# Patient Record
Sex: Male | Born: 1975 | Race: White | Hispanic: No | Marital: Single | Smoking: Former smoker
Health system: Southern US, Community
[De-identification: ages and names within clinical notes are randomized; demographics above are authoritative.]

## PROBLEM LIST (undated history)

## (undated) DIAGNOSIS — Z973 Presence of spectacles and contact lenses: Secondary | ICD-10-CM

## (undated) DIAGNOSIS — R7303 Prediabetes: Secondary | ICD-10-CM

## (undated) DIAGNOSIS — Z8489 Family history of other specified conditions: Secondary | ICD-10-CM

## (undated) DIAGNOSIS — K219 Gastro-esophageal reflux disease without esophagitis: Secondary | ICD-10-CM

## (undated) HISTORY — PX: TONSILLECTOMY: SUR1361

---

## 2008-09-30 ENCOUNTER — Emergency Department: Payer: Self-pay | Admitting: Emergency Medicine

## 2009-06-12 ENCOUNTER — Emergency Department: Payer: Self-pay | Admitting: Internal Medicine

## 2010-06-30 IMAGING — CR DG CHEST 2V
1 series · 2 of 2 positions shown · non-contrast
Comparison: none

REASON FOR EXAM: chest pain
COMMENTS:

[Series 1: view not recorded · 0.17mm/px · 2 of 2 slices shown]
[im 1/2]
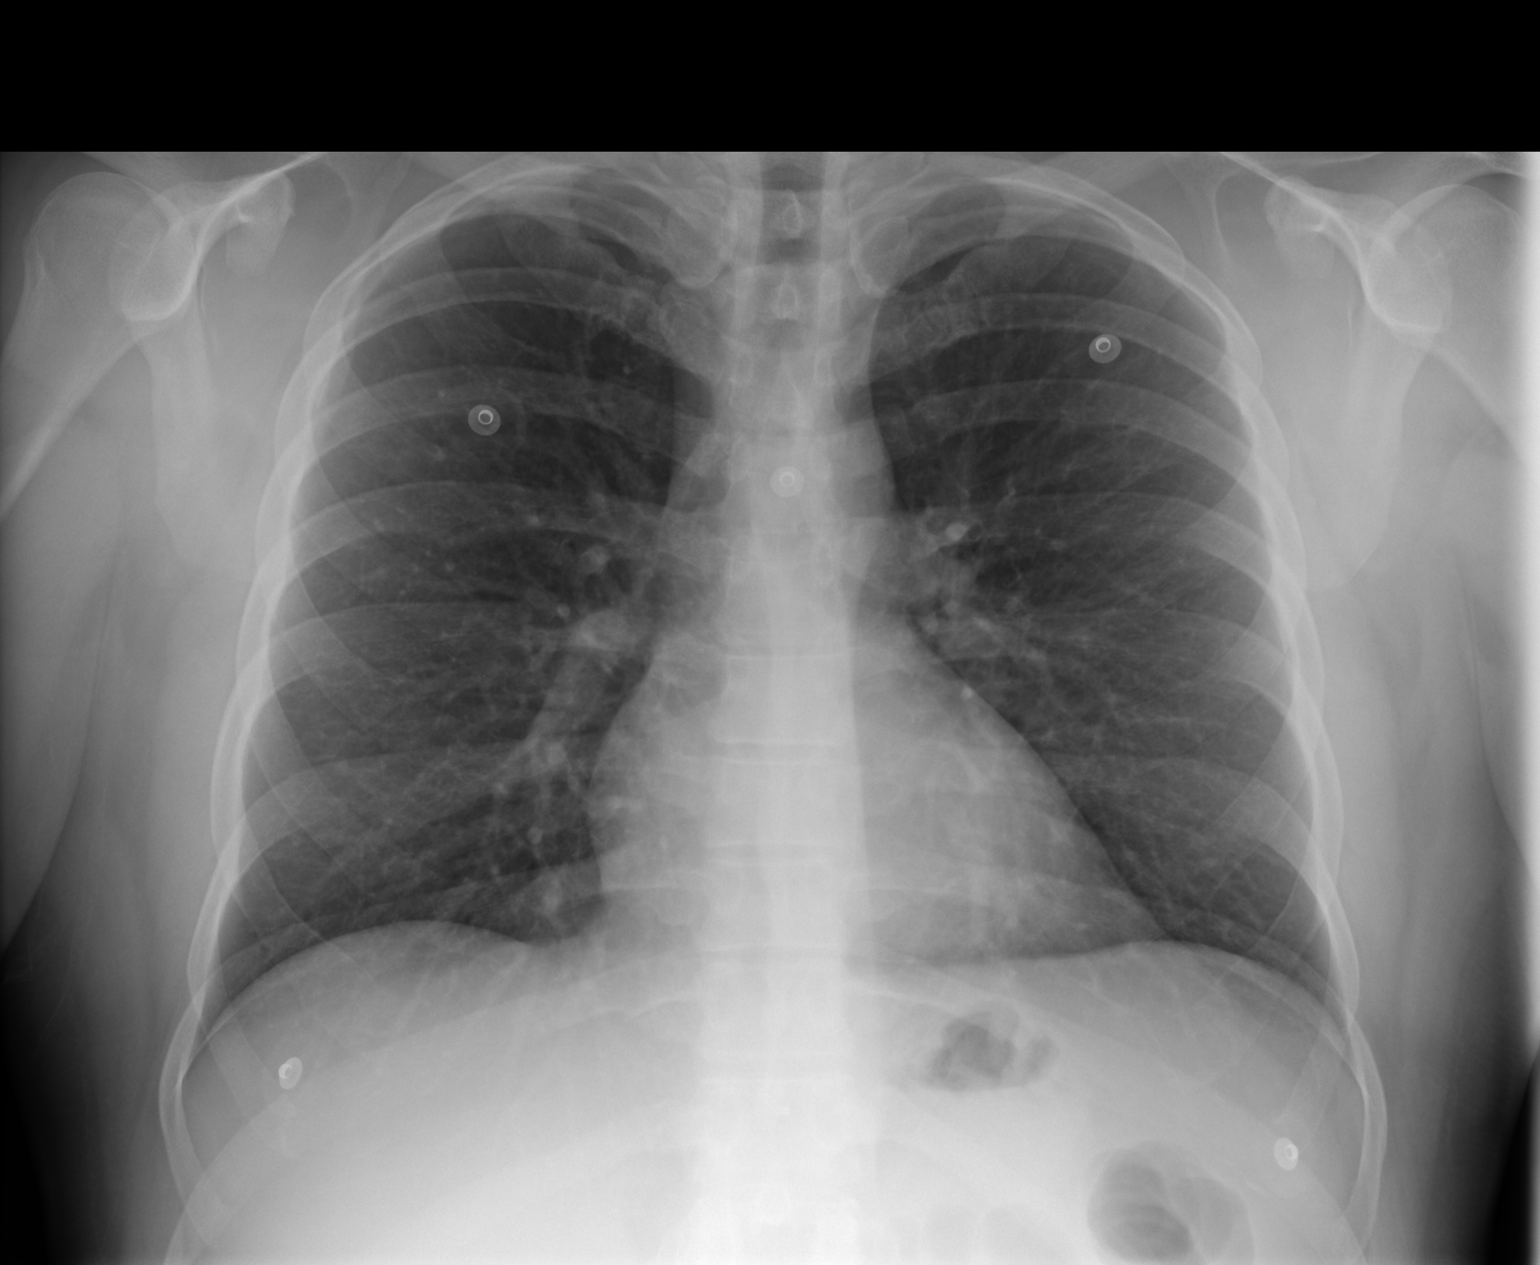
[im 2/2]
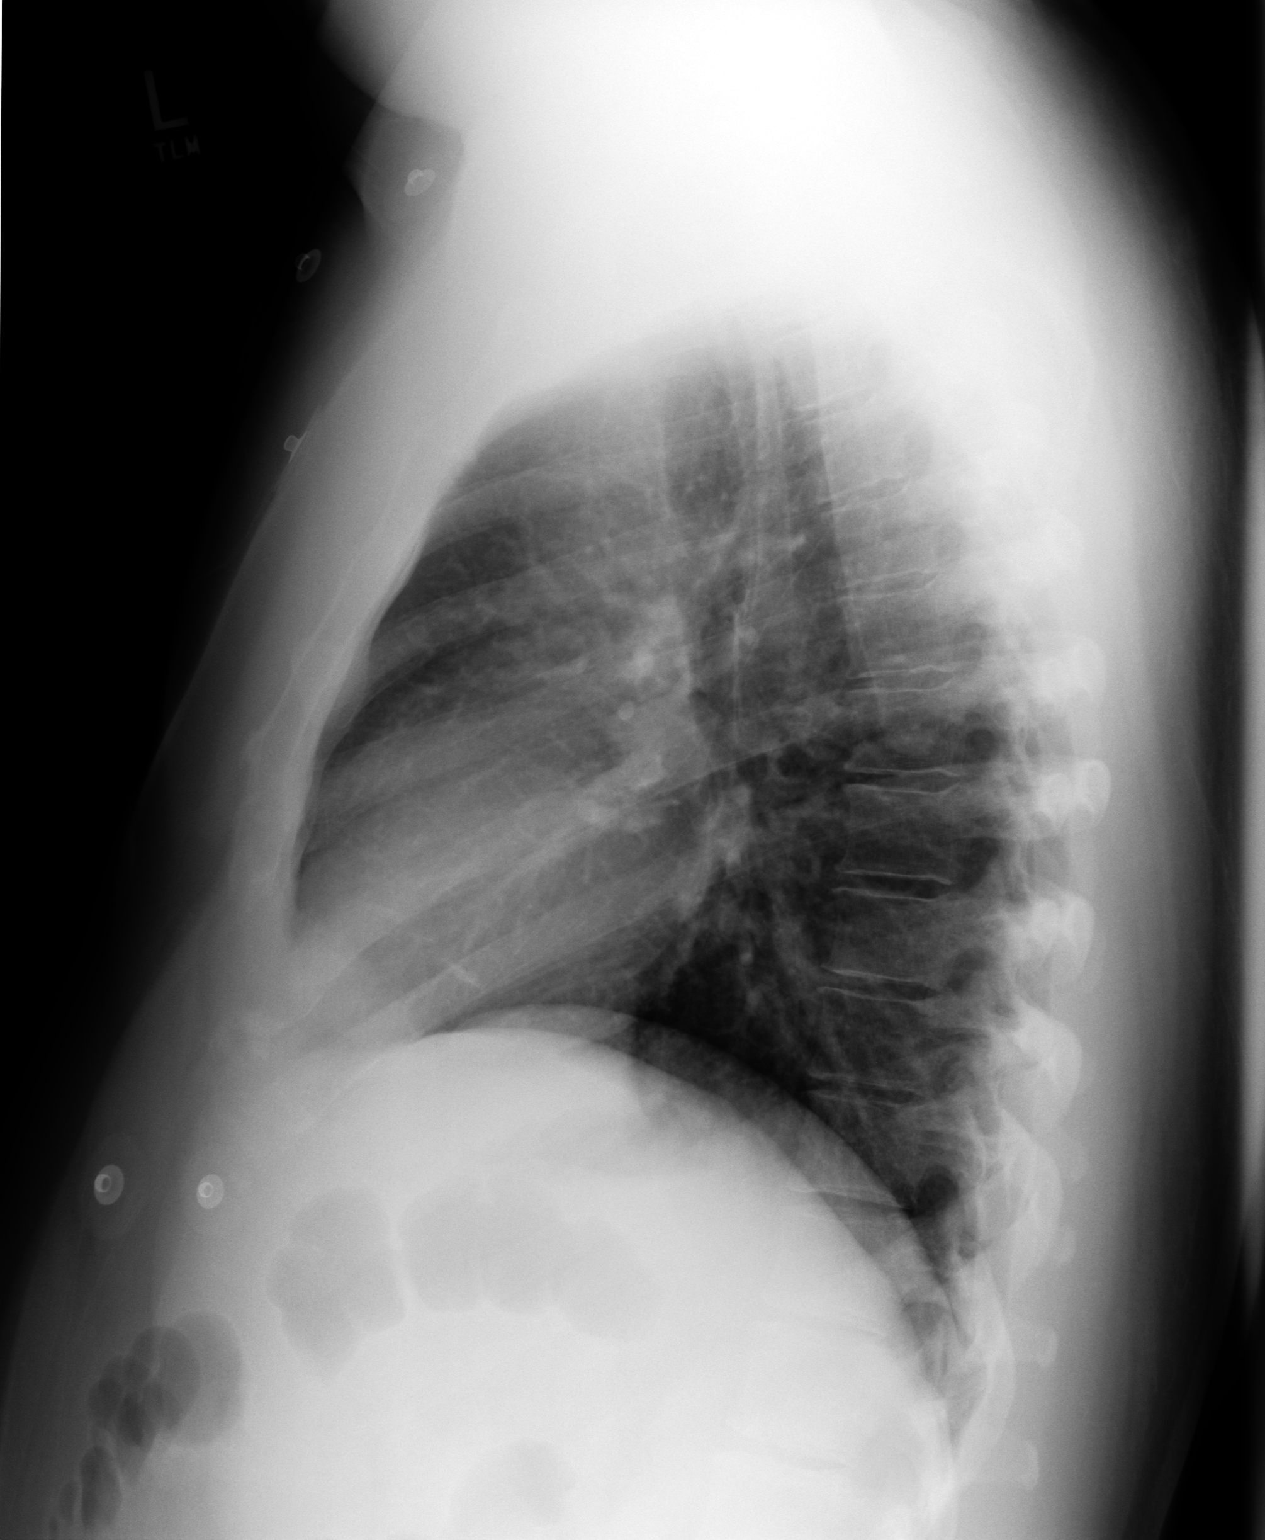

[2 of 2 positions shown; findings below may reference images not displayed]

PROCEDURE:     DXR - DXR CHEST PA (OR AP) AND LATERAL  - September 30, 2008 [DATE]

RESULT:     PA and lateral views of the chest show the lung fields to be
clear. No pneumonia, pneumothorax or pleural effusion is seen. The heart
size is normal. The mediastinal and osseous structures show no significant
abnormalities
IMPRESSION: No significant abnormalities are noted.

## 2012-07-11 ENCOUNTER — Inpatient Hospital Stay: Payer: Self-pay | Admitting: Internal Medicine

## 2012-07-11 LAB — URINALYSIS, COMPLETE
Bacteria: NONE SEEN
Glucose,UR: NEGATIVE mg/dL (ref 0–75)
Leukocyte Esterase: NEGATIVE
Nitrite: NEGATIVE
Specific Gravity: 1.02 (ref 1.003–1.030)
Squamous Epithelial: <1

## 2012-07-11 LAB — DIFFERENTIAL
Basophil #: 0.1 10*3/uL (ref 0.0–0.1)
Eosinophil #: 0.1 10*3/uL (ref 0.0–0.7)
Eosinophil %: 0.4 %
Lymphocyte #: 2 10*3/uL (ref 1.0–3.6)

## 2012-07-11 LAB — COMPREHENSIVE METABOLIC PANEL
Alkaline Phosphatase: 92 U/L (ref 50–136)
BUN: 11 mg/dL (ref 7–18)
Bilirubin,Total: 0.8 mg/dL (ref 0.2–1.0)
Chloride: 102 mmol/L (ref 98–107)
Co2: 27 mmol/L (ref 21–32)
EGFR (Non-African Amer.): >60
Glucose: 102 mg/dL — ABNORMAL HIGH (ref 65–99)
Osmolality: 277 (ref 275–301)
SGOT(AST): 26 U/L (ref 15–37)
SGPT (ALT): 33 U/L (ref 12–78)
Total Protein: 9.8 g/dL — ABNORMAL HIGH (ref 6.4–8.2)

## 2012-07-11 LAB — CBC
HGB: 14 g/dL (ref 13.0–18.0)
MCH: 30 pg (ref 26.0–34.0)
MCV: 91 fL (ref 80–100)
RBC: 4.67 10*6/uL (ref 4.40–5.90)
RDW: 12.5 % (ref 11.5–14.5)
WBC: 14.3 10*3/uL — ABNORMAL HIGH (ref 3.8–10.6)

## 2012-07-12 LAB — BASIC METABOLIC PANEL
Creatinine: 0.83 mg/dL (ref 0.60–1.30)
Glucose: 125 mg/dL — ABNORMAL HIGH (ref 65–99)
Potassium: 4.2 mmol/L (ref 3.5–5.1)

## 2012-07-12 LAB — CBC WITH DIFFERENTIAL/PLATELET
Basophil #: 0.1 10*3/uL
Basophil %: 0.9 %
Eosinophil #: 0 10*3/uL
Eosinophil %: 0 %
HCT: 40.1 %
HGB: 13.8 g/dL
Lymphocyte %: 11.8 %
Lymphs Abs: 1.4 10*3/uL
MCH: 31.2 pg
MCHC: 34.3 g/dL
MCV: 91 fL
Monocyte #: 0.4 10*3/uL
Monocyte %: 3.2 %
Neutrophil #: 10.2 10*3/uL — ABNORMAL HIGH
Neutrophil %: 84.1 %
Platelet: 311 10*3/uL
RBC: 4.41 x10 6/mm 3
RDW: 12.3 %
WBC: 12.1 10*3/uL — ABNORMAL HIGH

## 2012-07-12 LAB — RAPID INFLUENZA A&B ANTIGENS

## 2012-08-21 ENCOUNTER — Ambulatory Visit: Payer: Self-pay | Admitting: Otolaryngology

## 2012-08-25 ENCOUNTER — Emergency Department: Payer: Self-pay | Admitting: Emergency Medicine

## 2012-08-25 LAB — CBC
MCHC: 34.7 g/dL (ref 32.0–36.0)
Platelet: 356 10*3/uL (ref 150–440)
RBC: 4.5 10*6/uL (ref 4.40–5.90)
RDW: 13 % (ref 11.5–14.5)
WBC: 10.7 10*3/uL — ABNORMAL HIGH (ref 3.8–10.6)

## 2012-08-25 LAB — BASIC METABOLIC PANEL
Anion Gap: 6 — ABNORMAL LOW (ref 7–16)
BUN: 10 mg/dL (ref 7–18)
Calcium, Total: 8.9 mg/dL (ref 8.5–10.1)
Co2: 28 mmol/L (ref 21–32)
Osmolality: 273 (ref 275–301)
Potassium: 3.5 mmol/L (ref 3.5–5.1)

## 2014-08-13 NOTE — Discharge Summary (Signed)
PATIENT NAME:  Greg Carroll, Greg Carroll MR#:  [id number] DATE OF BIRTH:  [birth year]  DATE OF ADMISSION:  07/11/2012 DATE OF DISCHARGE:  07/13/2012  DISCHARGE DIAGNOSES:  1. Peritonsillar abscess.  2. Odynophagia.   PRIMARY CARE PHYSICIAN: Dr. Vonita MossMark Crissman in [place]   HISTORY OF PRESENT ILLNESS: The patient is a 39 year old male with known history of gastroesophageal reflux disease, being admitted for peritonsillar abscess. The patient started having severe sore throat since 2 days ago.  He went to urgent care center and back to [facility].  After 2 days,  he was diagnosed with streptococcal  throat infection and was prescribed Z-Pak.  He took it for 2 days and still in the night he was getting worse. He had trouble swallowing, chest congestion and some cough with phlegm.  He got so bad that he had severe body ache, unable to swallow anything, and so he decided to come to the Emergency Room. He was found having possible peritonsillar abscess. ENT, with Dr. Elenore RotaJuengel, saw the patient.  He tried an incision and drainage at bedside, but he was unsuccessful; and so he advised to admit with IV antibiotics, and once he starts swallowing oral then he can follow in the clinic later on.    HOSPITAL COURSE: He was started on IV clindamycin and IV Solu-Medrol and having significant response to the treatment within the next 2 days. He started eating a regular diet, and his swelling and pain went down.  He remained afebrile in the hospital and so was discharged on oral medication and advised to follow in the ENT.   Other medical issues addressed during this hospital stay:  1. Flu-like symptoms of body ache, fever. Possibly it was all because of peritonsillar abscess.  Flu test was negative.  2. Leukocytosis:  It was due to abscess.  3. Gastroesophageal reflux disease: Protonix was continued while he was in the hospital.  HOSPITAL LABORATORY DATA: WBC count was 14.3, came down to 12.1 on discharge. Influenza A and B were  negative. Urinalysis was grossly negative. Monospot stat was negative. Creatinine was 0.85.   CONDITION ON DISCHARGE: Stable.   CODE STATUS ON DISCHARGE:  FULL CODE.   DISCHARGE MEDICATIONS:  Prednisone 10 mg oral tablets, start at 60 mg and taper x 10 mg daily until complete, clindamycin 300 mg oral capsule 3 times a day for 6 days, lactobacillus acidophilus 1 capsule orally 2 times a day for 12 days.   DISCHARGE DIET:  Regular diet, consistency regular.   ACTIVITY: As tolerated.   TIMEFRAME TO FOLLOWUP: Within 1 to 2 weeks in ENT clinic; and if he develops diarrhea, then he was advised to call his doctor immediately. Need  tonsillectomy follow with ENT clinic and with primary care physician, Dr. Vonita MossMark Crissman.   TOTAL TIME SPENT: 45 minutes.   ____________________________ Hope PigeonVaibhavkumar G. Elisabeth PigeonVachhani, MD vgv:cb D: 07/17/2012 14:21:16 ET T: 07/17/2012 14:52:13 ET JOB#: [id number]  cc: Hope PigeonVaibhavkumar G. Elisabeth PigeonVachhani, MD, <Dictator> Steele SizerMark A. Crissman, MD Cammy CopaPaul H. Juengel, MD  Altamese DillingVAIBHAVKUMAR Shoni Quijas MD ELECTRONICALLY SIGNED 07/24/2012 14:54

## 2014-08-13 NOTE — H&P (Signed)
PATIENT NAME:  Elmon KirschnerSHATTERLY, Reace R MR#:  [id number] DATE OF BIRTH:  [birth year]  DATE OF ADMISSION:  07/11/2012  PRIMARY CARE PHYSICIAN:  Vonita MossMark Crissman, MD  REQUESTING PHYSICIAN:  Maricela BoLuna Ragsdale, MD   CHIEF COMPLAINT: Difficulty swallowing and sore throat.   HISTORY OF PRESENT ILLNESS: The patient is a 39 year old male with a known history of gastroesophageal reflux disease. He is being admitted for peritonsillar abscess. The patient started having sore throat and fever since Monday, went to urgent care and back to [facility]  on Wednesday where he was diagnosed with strep throat and prescribed Z-Pak. He took 2 tablets on Wednesday night and 1 tablet 1 tablet on Thursday, but kept getting worse. He was having more trouble swallowing, chest congestion and some cough with white phlegm.  Today he could not get bed, was having severe body ache and was unable to swallow anything and decided to come to the Emergency Department. While in the ED he was found to have possible peritonsillar abscess. ENT, Dr. Elenore RotaJuengel, saw the patient and tried to do an incision and drainage at bedside but  was unsuccessful. The patient was unable to take oral pills or even water/ice and is being admitted for further evaluation and monitoring.   PAST MEDICAL HISTORY:  GERD.  MEDICATIONS AT HOME:  None.   ALLERGIES:   1.  ASPIRIN. 2.  ERYTHROMYCIN. 3.  PENICILLIN. 4.  SULFA.  SOCIAL HISTORY: No smoking. No alcohol. He works at [employer].   FAMILY HISTORY: Mother has breast and diabetes. Father had coronary artery disease with 14 stents and bypass, also had MI and diabetes.   REVIEW OF SYSTEMS:   CONSTITUTIONAL: Positive for fever, fatigue, weakness and body ache.  EYES: No blurry or double vision.  ENT: No tinnitus or ear pain.  He has difficulty swallowing, painful swallowing and sore throat for the last 5 days.  Also has a lot of thick saliva.  RESPIRATORY: Positive for cough with whitish sputum and chest  congestion. No trouble breathing or hemoptysis.  CARDIOVASCULAR:  No chest pain, orthopnea or edema.  GASTROINTESTINAL: No nausea, vomiting, diarrhea, unable to swallow secondary to severe sore throat. GENITOURINARY:  No dysuria or hematuria.  ENDOCRINE: No polyuria or nocturia.  HEMATOLOGY: No anemia or easy bruising.  SKIN: No rash or lesion.  MUSCULOSKELETAL: No arthritis or muscle cramps. Positive for severe body ache.  NEUROLOGIC: No tingling, numbness or weakness.  PSYCHIATRIC: No history of anxiety or depression.   PHYSICAL EXAMINATION: VITAL SIGNS: Temperature 99.8, heart rate 125 per minute, respirations 18 per minute, blood pressure 127/78, saturating 96% on room air. GENERAL:  the patient is a 39 year old male lying in the bed comfortably without any acute distress.  EYES: Pupils equal, round and reactive date to light and accommodation. No scleral icterus. Extraocular muscles intact.  HEENT:  Head atraumatic, normocephalic. Oropharynx:  He has peritonsillar edema and left peritonsillar fullness, difficulty to evaluate the back of his throat as he has very thick saliva and congestion.  Nasopharynx clear. NECK: Supple. No jugular venous distention. No thyroid enlargement or tenderness.  LUNGS: Clear to auscultation bilaterally. No wheezing, rales, rhonchi or crepitation.  CARDIOVASCULAR: S1, S2 normal. No murmur, rubs or gallop.  ABDOMEN: Soft, nontender, nondistended. Bowel sounds present. No organomegaly or masses.  EXTREMITIES: No cyanosis or clubbing.  NEUROLOGIC:  Cranial nerves II through XII are intact.  Muscle strength 5/5 in all extremities.  Sensation intact. PSYCHIATRIC: The patient is oriented to time, place and  person x 3.  SKIN: No obvious rash, lesion or ulcer.   LABORATORY AND DIAGNOSTIC DATA:  Normal BMP. Normal liver function tests. Normal CBC except white count of 14.3. Negative urinalysis, negative Mono test.   IMPRESSION AND PLAN: 1.  Peritonsillar abscess  status post unsuccessful incision and drainage by Dr. Elenore Rota at bedside. We will start him on intravenous clindamycin with careful monitoring as he does have allergy to ERYTHROMYCIN and PENICILLIN (mainly itching).  We will also start him on intravenous  Solu-Medrol and some Benadryl he had significant dysphagia and odynophagia.  We will consider ENT consult if he does not improve with above regimen. We will keep him on clear liquid diet if he can take it. 2.  Flu-like symptoms.  We will check influenza A and B.  His Mono test is negative.  We will hold off  Tamiflu as he cannot swallow anything at this time anyway.  3.  Leukocytosis, likely due to peritonsillar abscess. Will monitor.  4.  Gastroesophageal reflux disease.  We will start him on Protonix.  5.  CODE STATUS: Full code.    Total time taking care of this patient: 55 minutes.    ____________________________ Ellamae Sia. Sherryll Burger, MD vss:ct D: 07/11/2012 23:18:48 ET T: 07/12/2012 07:25:06 ET JOB#: [id number]  cc: Ryliee Figge S. Sherryll Burger, MD, <Dictator> Steele Sizer, MD Maricela Bo, MD Cammy Copa, MD   Ellamae Sia [facility] MD ELECTRONICALLY SIGNED 07/12/2012 23:38

## 2014-08-13 NOTE — Consult Note (Signed)
PATIENT NAME:  Greg Carroll, Greg Carroll MR#:  [id number] DATE OF BIRTH:  16-Jun-1975  DATE OF CONSULTATION:  07/11/2012  REFERRING PHYSICIAN:      Delfino LovettVipul Shah, MD CONSULTING PHYSICIAN:  Cammy CopaPaul H. Angelin Cutrone, MD  REASON FOR CONSULTATION: Evaluate for peritonsillar abscess and drain.   HISTORY OF PRESENT ILLNESS: The patient is a 39 year old white male who has had onset of acute sore throat on the left mostly 4 days ago. It has slowly gotten worse. He was seen Monday and started on a Z-Pak.  He has been taking that and now getting where he is having a hard time swallowing and spitting out some of his saliva. He is  noted to have significant swelling the left side throat and possible peritonsillar abscess in the Emergency Room and consultation is placed for evaluation.   PAST MEDICAL HISTORY: Significant for having recurrent sore throats 1 or 2 a year every year, usually of strep. He is on antibiotics and settles down. He has never had an abscess before that he knows of. He is otherwise fairly healthy.   PHYSICAL EXAMINATION: GENERAL: The patient is awake and alert, is a good historian. He is spitting out some saliva.  HEENT:  He has slight trismus and swelling in the left lateral neck, a little tenderness there.  It is a little bit warm. The oropharynx shows uvula pushed to the right side slightly. His left tonsil was large.  There was no major exudate on the tonsil. There is redness along the palate and tonsil itself. I cannot see his hypopharynx well secondary to the swelling.  Tongue mobility is good.  The nose is open and clear.  PROCEDURE: A left peritonsillar abscess was suspected and the area was sprayed with Cetacaine spray.  An 18-gauge needle was used for aspirating the left peritonsillar area.  Two separate aspirates were made and there is no evidence of abscess that was found. No purulence was aspirated.  The patient tolerated this very well.  IMPRESSION: The patient has acute left peritonsillar  cellulitis but no obvious abscess yet at this time. He has been switched to Cleocin and given some of this intravenously along with intravenous  morphine and Zofran. He has been given Decadron as well. He should remain on the intravenous medications until he is able to swallow orally, that may be this evening but if he continues to have problems with pain, then he should remain on these overnight and then potentially go home tomorrow or the next day.  Whenever he is able to swallow well, he can switch to oral Cleocin 300 mg t.i.d. and stick with a prednisone taper and pain medication as necessary. I think he is a good candidate for tonsillectomy in the future because of recurring tonsil problems. I have discussed this with him and he will arrange a follow-up in the ENT office for arranging a tonsillectomy once he is over the acute infection.     ____________________________ Cammy CopaPaul H. Jerusha Reising, MD phj:ct D: 07/11/2012 20:00:50 ET T: 07/12/2012 08:18:24 ET JOB#: [id number]  cc: Cammy CopaPaul H. Jameca Chumley, MD, <Dictator> Cammy CopaPAUL H Meleah Demeyer MD ELECTRONICALLY SIGNED 07/15/2012 13:20

## 2015-11-16 ENCOUNTER — Other Ambulatory Visit
Admission: RE | Admit: 2015-11-16 | Discharge: 2015-11-16 | Disposition: A | Discharge status: Home / Self Care | Payer: Self-pay | Source: Ambulatory Visit | Attending: Family Medicine | Admitting: Family Medicine

## 2015-11-16 DIAGNOSIS — R0789 Other chest pain: Secondary | ICD-10-CM | POA: Insufficient documentation

## 2015-11-16 LAB — TROPONIN I

## 2015-11-16 LAB — CKMB (ARMC ONLY): CK, MB: 1 ng/mL (ref 0.5–5.0)

## 2015-11-24 ENCOUNTER — Encounter: Payer: Self-pay | Admitting: *Deleted

## 2015-11-24 ENCOUNTER — Encounter
Admission: RE | Disposition: A | Discharge status: Home / Self Care | Payer: Self-pay | Source: Ambulatory Visit | Attending: Internal Medicine

## 2015-11-24 ENCOUNTER — Ambulatory Visit
Admission: RE | Admit: 2015-11-24 | Discharge: 2015-11-24 | Disposition: A | Discharge status: Home / Self Care | Payer: Self-pay | Source: Ambulatory Visit | Attending: Internal Medicine | Admitting: Internal Medicine

## 2015-11-24 DIAGNOSIS — Z803 Family history of malignant neoplasm of breast: Secondary | ICD-10-CM | POA: Insufficient documentation

## 2015-11-24 DIAGNOSIS — E669 Obesity, unspecified: Secondary | ICD-10-CM | POA: Insufficient documentation

## 2015-11-24 DIAGNOSIS — Z8249 Family history of ischemic heart disease and other diseases of the circulatory system: Secondary | ICD-10-CM | POA: Insufficient documentation

## 2015-11-24 DIAGNOSIS — Z79899 Other long term (current) drug therapy: Secondary | ICD-10-CM | POA: Insufficient documentation

## 2015-11-24 DIAGNOSIS — R0602 Shortness of breath: Secondary | ICD-10-CM | POA: Insufficient documentation

## 2015-11-24 DIAGNOSIS — Z881 Allergy status to other antibiotic agents status: Secondary | ICD-10-CM | POA: Insufficient documentation

## 2015-11-24 DIAGNOSIS — Z6832 Body mass index (BMI) 32.0-32.9, adult: Secondary | ICD-10-CM | POA: Insufficient documentation

## 2015-11-24 DIAGNOSIS — Z87891 Personal history of nicotine dependence: Secondary | ICD-10-CM | POA: Insufficient documentation

## 2015-11-24 DIAGNOSIS — Z833 Family history of diabetes mellitus: Secondary | ICD-10-CM | POA: Insufficient documentation

## 2015-11-24 DIAGNOSIS — K219 Gastro-esophageal reflux disease without esophagitis: Secondary | ICD-10-CM | POA: Insufficient documentation

## 2015-11-24 DIAGNOSIS — Z880 Allergy status to penicillin: Secondary | ICD-10-CM | POA: Insufficient documentation

## 2015-11-24 DIAGNOSIS — I200 Unstable angina: Secondary | ICD-10-CM | POA: Insufficient documentation

## 2015-11-24 DIAGNOSIS — Z886 Allergy status to analgesic agent status: Secondary | ICD-10-CM | POA: Insufficient documentation

## 2015-11-24 DIAGNOSIS — E785 Hyperlipidemia, unspecified: Secondary | ICD-10-CM | POA: Insufficient documentation

## 2015-11-24 DIAGNOSIS — R7303 Prediabetes: Secondary | ICD-10-CM | POA: Insufficient documentation

## 2015-11-24 DIAGNOSIS — R0600 Dyspnea, unspecified: Secondary | ICD-10-CM | POA: Insufficient documentation

## 2015-11-24 DIAGNOSIS — Z882 Allergy status to sulfonamides status: Secondary | ICD-10-CM | POA: Insufficient documentation

## 2015-11-24 HISTORY — PX: CARDIAC CATHETERIZATION: SHX172

## 2015-11-24 SURGERY — LEFT HEART CATH AND CORONARY ANGIOGRAPHY
Anesthesia: Moderate Sedation | Laterality: Left

## 2015-11-24 SURGERY — LEFT HEART CATH AND CORONARY ANGIOGRAPHY
Anesthesia: Moderate Sedation

## 2015-11-24 MED ORDER — SODIUM CHLORIDE 0.9 % WEIGHT BASED INFUSION: 1.0000 mL/kg/h | INTRAVENOUS | Status: DC

## 2015-11-24 MED ORDER — SODIUM CHLORIDE 0.9 % IV SOLN
INTRAVENOUS | Status: DC
  Administered 2015-11-24: 13:00:00 via INTRAVENOUS

## 2015-11-24 MED ORDER — IOPAMIDOL (ISOVUE-300) INJECTION 61%
INTRAVENOUS | Status: DC | PRN
  Administered 2015-11-24: 110 mL via INTRA_ARTERIAL

## 2015-11-24 MED ORDER — ASPIRIN 81 MG PO CHEW: 81.0000 mg | CHEWABLE_TABLET | ORAL | Status: DC

## 2015-11-24 MED ORDER — MIDAZOLAM HCL 2 MG/2ML IJ SOLN
INTRAMUSCULAR | Status: AC
  Filled 2015-11-24: qty 2

## 2015-11-24 MED ORDER — SODIUM CHLORIDE 0.9 % WEIGHT BASED INFUSION: 3.0000 mL/kg/h | INTRAVENOUS | Status: DC

## 2015-11-24 MED ORDER — SODIUM CHLORIDE 0.9% FLUSH: 3.0000 mL | INTRAVENOUS | Status: DC | PRN

## 2015-11-24 MED ORDER — SODIUM CHLORIDE 0.9% FLUSH
3.0000 mL | Freq: Two times a day (BID) | INTRAVENOUS | Status: DC
Start: 2015-11-24 — End: 2015-11-24

## 2015-11-24 MED ORDER — FENTANYL CITRATE (PF) 100 MCG/2ML IJ SOLN
INTRAMUSCULAR | Status: DC | PRN
  Administered 2015-11-24 (×2): 25 ug

## 2015-11-24 MED ORDER — SODIUM CHLORIDE 0.9 % IV SOLN: 250.0000 mL | INTRAVENOUS | Status: DC | PRN

## 2015-11-24 MED ORDER — FENTANYL CITRATE (PF) 100 MCG/2ML IJ SOLN
INTRAMUSCULAR | Status: AC
  Filled 2015-11-24: qty 2

## 2015-11-24 MED ORDER — MIDAZOLAM HCL 2 MG/2ML IJ SOLN
INTRAMUSCULAR | Status: DC | PRN
  Administered 2015-11-24 (×2): 1 mg via INTRAVENOUS

## 2015-11-24 MED ORDER — HEPARIN (PORCINE) IN NACL 2-0.9 UNIT/ML-% IJ SOLN
INTRAMUSCULAR | Status: AC
  Filled 2015-11-24: qty 500

## 2015-11-24 SURGICAL SUPPLY — 9 items
CATH INFINITI 5FR ANG PIGTAIL (CATHETERS) ×3 IMPLANT
CATH INFINITI 5FR JL4 (CATHETERS) ×3 IMPLANT
CATH INFINITI JR4 5F (CATHETERS) ×3 IMPLANT
DEVICE CLOSURE MYNXGRIP 5F (Vascular Products) ×3 IMPLANT
KIT MANI 3VAL PERCEP (MISCELLANEOUS) ×3 IMPLANT
NEEDLE PERC 18GX7CM (NEEDLE) ×3 IMPLANT
PACK CARDIAC CATH (CUSTOM PROCEDURE TRAY) ×3 IMPLANT
SHEATH AVANTI 5FR X 11CM (SHEATH) ×3 IMPLANT
WIRE EMERALD 3MM-J .035X150CM (WIRE) ×3 IMPLANT

## 2015-11-24 NOTE — Discharge Instructions (Signed)

## 2015-11-25 ENCOUNTER — Encounter: Payer: Self-pay | Admitting: Internal Medicine

## 2018-03-06 ENCOUNTER — Encounter: Payer: Self-pay | Admitting: *Deleted

## 2018-03-06 ENCOUNTER — Other Ambulatory Visit: Payer: Self-pay

## 2018-03-10 NOTE — Discharge Instructions (Signed)
[facility] °[facility] °ENDOSCOPIC SINUS SURGERY °[facility] ° °What is Functional Endoscopic Sinus Surgery? ° The Surgery involves making the natural openings of the sinuses larger by removing the bony partitions that separate the sinuses from the nasal cavity.  The natural sinus lining is preserved as much as possible to allow the sinuses to resume normal function after the surgery.  In some patients nasal polyps (excessively swollen lining of the sinuses) may be removed to relieve obstruction of the sinus openings.  The surgery is performed through the nose using lighted scopes, which eliminates the need for incisions on the face.  A septoplasty is a different procedure which is sometimes performed with sinus surgery.  It involves straightening the boy partition that separates the two sides of your nose.  A crooked or deviated septum may need repair if is obstructing the sinuses or nasal airflow.  Turbinate reduction is also often performed during sinus surgery.  The turbinates are bony proturberances from the side walls of the nose which swell and can obstruct the nose in patients with sinus and allergy problems.  Their size can be surgically reduced to help relieve nasal obstruction. ° °What Can Sinus Surgery Do For Me? ° Sinus surgery can reduce the frequency of sinus infections requiring antibiotic treatment.  This can provide improvement in nasal congestion, post-nasal drainage, facial pressure and nasal obstruction.  Surgery will NOT prevent you from ever having an infection again, so it usually only for patients who get infections 4 or more times yearly requiring antibiotics, or for infections that do not clear with antibiotics.  It will not cure nasal allergies, so patients with allergies may still require medication to treat their allergies after surgery. Surgery may improve headaches related to sinusitis, however, some people will continue to  require medication to control sinus headaches related to allergies.  Surgery will do nothing for other forms of headache (migraine, tension or cluster). ° °What Are the Risks of Endoscopic Sinus Surgery? ° Current techniques allow surgery to be performed safely with little risk, however, there are rare complications that patients should be aware of.  Because the sinuses are located around the eyes, there is risk of eye injury, including blindness, though again, this would be quite rare. This is usually a result of bleeding behind the eye during surgery, which puts the vision oat risk, though there are treatments to protect the vision and prevent permanent disrupted by surgery causing a leak of the spinal fluid that surrounds the brain.  More serious complications would include bleeding inside the brain cavity or damage to the brain.  Again, all of these complications are uncommon, and spinal fluid leaks can be safely managed surgically if they occur.  The most common complication of sinus surgery is bleeding from the nose, which may require packing or cauterization of the nose.  Continued sinus have polyps may experience recurrence of the polyps requiring revision surgery.  Alterations of sense of smell or injury to the tear ducts are also rare complications.  ° °What is the Surgery Like, and what is the Recovery? ° The Surgery usually takes a couple of hours to perform, and is usually performed under a general anesthetic (completely asleep).  Patients are usually discharged home after a couple of hours.  Sometimes during surgery it is necessary to pack the nose to control bleeding, and the packing is left in place for 24 - 48 hours, and removed by your surgeon.    If a septoplasty was performed during the procedure, there is often a splint placed which must be removed after 5-7 days.   °Discomfort: Pain is usually mild to moderate, and can be controlled by prescription pain medication or acetaminophen (Tylenol).   Aspirin, Ibuprofen (Advil, Motrin), or Naprosyn (Aleve) should be avoided, as they can cause increased bleeding.  Most patients feel sinus pressure like they have a bad head cold for several days.  Sleeping with your head elevated can help reduce swelling and facial pressure, as can ice packs over the face.  A humidifier may be helpful to keep the mucous and blood from drying in the nose.  ° °Diet: There are no specific diet restrictions, however, you should generally start with clear liquids and a light diet of bland foods because the anesthetic can cause some nausea.  Advance your diet depending on how your stomach feels.  Taking your pain medication with food will often help reduce stomach upset which pain medications can cause. ° °Nasal Saline Irrigation: It is important to remove blood clots and dried mucous from the nose as it is healing.  This is done by having you irrigate the nose at least 3 - 4 times daily with a salt water solution.  We recommend using NeilMed Sinus Rinse (available at the drug store).  Fill the squeeze bottle with the solution, bend over a sink, and insert the tip of the squeeze bottle into the nose ½ of an inch.  Point the tip of the squeeze bottle towards the inside corner of the eye on the same side your irrigating.  Squeeze the bottle and gently irrigate the nose.  If you bend forward as you do this, most of the fluid will flow back out of the nose, instead of down your throat.   The solution should be warm, near body temperature, when you irrigate.   Each time you irrigate, you should use a full squeeze bottle.  ° °Note that if you are instructed to use Nasal Steroid Sprays at any time after your surgery, irrigate with saline BEFORE using the steroid spray, so you do not wash it all out of the nose. °Another product, Nasal Saline Gel (such as AYR Nasal Saline Gel) can be applied in each nostril 3 - 4 times daily to moisture the nose and reduce scabbing or crusting. ° °Bleeding:   Bloody drainage from the nose can be expected for several days, and patients are instructed to irrigate their nose frequently with salt water to help remove mucous and blood clots.  The drainage may be dark red or brown, though some fresh blood may be seen intermittently, especially after irrigation.  Do not blow you nose, as bleeding may occur. If you must sneeze, keep your mouth open to allow air to escape through your mouth. ° °If heavy bleeding occurs: Irrigate the nose with saline to rinse out clots, then spray the nose 3 - 4 times with Afrin Nasal Decongestant Spray.  The spray will constrict the blood vessels to slow bleeding.  Pinch the lower half of your nose shut to apply pressure, and lay down with your head elevated.  Ice packs over the nose may help as well. If bleeding persists despite these measures, you should notify your doctor.  Do not use the Afrin routinely to control nasal congestion after surgery, as it can result in worsening congestion and may affect healing.  ° ° ° °Activity: Return to work varies among patients. Most patients will be   out of work at least 5 - 7 days to recover.  Patient may return to work after they are off of narcotic pain medication, and feeling well enough to perform the functions of their job.  Patients must avoid heavy lifting (over 10 pounds) or strenuous physical for 2 weeks after surgery, so your employer may need to assign you to light duty, or keep you out of work longer if light duty is not possible.  NOTE: you should not drive, operate dangerous machinery, do any mentally demanding tasks or make any important legal or financial decisions while on narcotic pain medication and recovering from the general anesthetic.  °  °Call Your Doctor Immediately if You Have Any of the Following: °1. Bleeding that you cannot control with the above measures °2. Loss of vision, double vision, bulging of the eye or black eyes. °3. Fever over 101 degrees °4. Neck stiffness with  severe headache, fever, nausea and change in mental state. °You are always encourage to call anytime with concerns, however, please call with requests for pain medication refills during office hours. ° °Office Endoscopy: During follow-up visits your doctor will remove any packing or splints that may have been placed and evaluate and clean your sinuses endoscopically.  Topical anesthetic will be used to make this as comfortable as possible, though you may want to take your pain medication prior to the visit.  How often this will need to be done varies from patient to patient.  After complete recovery from the surgery, you may need follow-up endoscopy from time to time, particularly if there is concern of recurrent infection or nasal polyps. ° ° °General Anesthesia, Adult, Care After °These instructions provide you with information about caring for yourself after your procedure. Your health care provider may also give you more specific instructions. Your treatment has been planned according to current medical practices, but problems sometimes occur. Call your health care provider if you have any problems or questions after your procedure. °What can I expect after the procedure? °After the procedure, it is common to have: °· Vomiting. °· A sore throat. °· Mental slowness. ° °It is common to feel: °· Nauseous. °· Cold or shivery. °· Sleepy. °· Tired. °· Sore or achy, even in parts of your body where you did not have surgery. ° °Follow these instructions at home: °For at least 24 hours after the procedure: °· Do not: °? Participate in activities where you could fall or become injured. °? Drive. °? Use heavy machinery. °? Drink alcohol. °? Take sleeping pills or medicines that cause drowsiness. °? Make important decisions or sign legal documents. °? Take care of children on your own. °· Rest. °Eating and drinking °· If you vomit, drink water, juice, or soup when you can drink without vomiting. °· Drink enough fluid to  keep your urine clear or pale yellow. °· Make sure you have little or no nausea before eating solid foods. °· Follow the diet recommended by your health care provider. °General instructions °· Have a responsible adult stay with you until you are awake and alert. °· Return to your normal activities as told by your health care provider. Ask your health care provider what activities are safe for you. °· Take over-the-counter and prescription medicines only as told by your health care provider. °· If you smoke, do not smoke without supervision. °· Keep all follow-up visits as told by your health care provider. This is important. °Contact a health care provider if: °· You   continue to have nausea or vomiting at home, and medicines are not helpful. °· You cannot drink fluids or start eating again. °· You cannot urinate after 8-12 hours. °· You develop a skin rash. °· You have fever. °· You have increasing redness at the site of your procedure. °Get help right away if: °· You have difficulty breathing. °· You have chest pain. °· You have unexpected bleeding. °· You feel that you are having a life-threatening or urgent problem. °This information is not intended to replace advice given to you by your health care provider. Make sure you discuss any questions you have with your health care provider. °Document Released: 07/16/2000 Document Revised: 09/12/2015 Document Reviewed: 03/24/2015 °[employer] Interactive Patient Education © 2018 [employer] ° °

## 2018-03-12 ENCOUNTER — Ambulatory Visit: Payer: Managed Care, Other (non HMO) | Admitting: Anesthesiology

## 2018-03-12 ENCOUNTER — Encounter
Admission: RE | Disposition: A | Discharge status: Home / Self Care | Payer: Self-pay | Source: Ambulatory Visit | Attending: Otolaryngology

## 2018-03-12 ENCOUNTER — Ambulatory Visit
Admission: RE | Admit: 2018-03-12 | Discharge: 2018-03-12 | Disposition: A | Discharge status: Home / Self Care | Payer: Managed Care, Other (non HMO) | Source: Ambulatory Visit | Attending: Otolaryngology | Admitting: Otolaryngology

## 2018-03-12 DIAGNOSIS — J342 Deviated nasal septum: Secondary | ICD-10-CM | POA: Diagnosis not present

## 2018-03-12 DIAGNOSIS — K219 Gastro-esophageal reflux disease without esophagitis: Secondary | ICD-10-CM | POA: Diagnosis not present

## 2018-03-12 DIAGNOSIS — J322 Chronic ethmoidal sinusitis: Secondary | ICD-10-CM | POA: Diagnosis not present

## 2018-03-12 DIAGNOSIS — J343 Hypertrophy of nasal turbinates: Secondary | ICD-10-CM | POA: Diagnosis not present

## 2018-03-12 DIAGNOSIS — J338 Other polyp of sinus: Secondary | ICD-10-CM | POA: Diagnosis not present

## 2018-03-12 DIAGNOSIS — J320 Chronic maxillary sinusitis: Secondary | ICD-10-CM | POA: Diagnosis not present

## 2018-03-12 DIAGNOSIS — Z79899 Other long term (current) drug therapy: Secondary | ICD-10-CM | POA: Diagnosis not present

## 2018-03-12 DIAGNOSIS — Z87891 Personal history of nicotine dependence: Secondary | ICD-10-CM | POA: Diagnosis not present

## 2018-03-12 HISTORY — DX: Family history of other specified conditions: Z84.89

## 2018-03-12 HISTORY — PX: TURBINATE REDUCTION: SHX6157

## 2018-03-12 HISTORY — DX: Prediabetes: R73.03

## 2018-03-12 HISTORY — DX: Gastro-esophageal reflux disease without esophagitis: K21.9

## 2018-03-12 HISTORY — PX: MAXILLARY ANTROSTOMY: SHX2003

## 2018-03-12 HISTORY — PX: ETHMOIDECTOMY: SHX5197

## 2018-03-12 HISTORY — PX: IMAGE GUIDED SINUS SURGERY: SHX6570

## 2018-03-12 HISTORY — DX: Presence of spectacles and contact lenses: Z97.3

## 2018-03-12 SURGERY — SINUS SURGERY, WITH IMAGING GUIDANCE
Anesthesia: General | Site: Nose

## 2018-03-12 MED ORDER — LACTATED RINGERS IV SOLN
10.0000 mL/h | INTRAVENOUS | Status: DC
  Administered 2018-03-12: 10 mL/h via INTRAVENOUS

## 2018-03-12 MED ORDER — ACETAMINOPHEN 10 MG/ML IV SOLN
1000.0000 mg | Freq: Once | INTRAVENOUS | Status: AC
  Administered 2018-03-12: 1000 mg via INTRAVENOUS

## 2018-03-12 MED ORDER — PROMETHAZINE HCL 25 MG/ML IJ SOLN: 6.2500 mg | INTRAMUSCULAR | Status: DC | PRN

## 2018-03-12 MED ORDER — FENTANYL CITRATE (PF) 100 MCG/2ML IJ SOLN
INTRAMUSCULAR | Status: DC | PRN
  Administered 2018-03-12: 100 ug via INTRAVENOUS
  Administered 2018-03-12: 25 ug via INTRAVENOUS

## 2018-03-12 MED ORDER — LIDOCAINE HCL (CARDIAC) PF 100 MG/5ML IV SOSY
PREFILLED_SYRINGE | INTRAVENOUS | Status: DC | PRN
  Administered 2018-03-12: 40 mg via INTRAVENOUS

## 2018-03-12 MED ORDER — DEXAMETHASONE SODIUM PHOSPHATE 4 MG/ML IJ SOLN
INTRAMUSCULAR | Status: DC | PRN
  Administered 2018-03-12: 10 mg via INTRAVENOUS

## 2018-03-12 MED ORDER — GLYCOPYRROLATE 0.2 MG/ML IJ SOLN
INTRAMUSCULAR | Status: DC | PRN
  Administered 2018-03-12: 0.1 mg via INTRAVENOUS

## 2018-03-12 MED ORDER — MIDAZOLAM HCL 5 MG/5ML IJ SOLN
INTRAMUSCULAR | Status: DC | PRN
  Administered 2018-03-12: 2 mg via INTRAVENOUS

## 2018-03-12 MED ORDER — OXYMETAZOLINE HCL 0.05 % NA SOLN
NASAL | Status: DC | PRN
  Administered 2018-03-12: 1 via TOPICAL

## 2018-03-12 MED ORDER — LIDOCAINE-EPINEPHRINE 1 %-1:100000 IJ SOLN
INTRAMUSCULAR | Status: DC | PRN
  Administered 2018-03-12: 9 mL

## 2018-03-12 MED ORDER — CLINDAMYCIN HCL 300 MG PO CAPS: 300.0000 mg | ORAL_CAPSULE | Freq: Three times a day (TID) | ORAL | 0 refills | Status: AC

## 2018-03-12 MED ORDER — OXYCODONE HCL 5 MG PO TABS
5.0000 mg | ORAL_TABLET | Freq: Once | ORAL | Status: AC | PRN
  Administered 2018-03-12: 5 mg via ORAL

## 2018-03-12 MED ORDER — ROCURONIUM BROMIDE 100 MG/10ML IV SOLN
INTRAVENOUS | Status: DC | PRN
  Administered 2018-03-12: 20 mg via INTRAVENOUS

## 2018-03-12 MED ORDER — OXYCODONE HCL 5 MG/5ML PO SOLN: 5.0000 mg | Freq: Once | ORAL | Status: AC | PRN

## 2018-03-12 MED ORDER — FENTANYL CITRATE (PF) 100 MCG/2ML IJ SOLN: 25.0000 ug | INTRAMUSCULAR | Status: DC | PRN

## 2018-03-12 MED ORDER — PROPOFOL 10 MG/ML IV BOLUS
INTRAVENOUS | Status: DC | PRN
  Administered 2018-03-12: 150 mg via INTRAVENOUS

## 2018-03-12 MED ORDER — ONDANSETRON HCL 4 MG/2ML IJ SOLN
INTRAMUSCULAR | Status: DC | PRN
  Administered 2018-03-12: 4 mg via INTRAVENOUS

## 2018-03-12 MED ORDER — ONDANSETRON HCL 4 MG PO TABS: 4.0000 mg | ORAL_TABLET | Freq: Three times a day (TID) | ORAL | 0 refills | Status: AC | PRN

## 2018-03-12 MED ORDER — OXYCODONE-ACETAMINOPHEN 5-325 MG PO TABS: 1.0000 | ORAL_TABLET | Freq: Four times a day (QID) | ORAL | 0 refills | Status: AC | PRN

## 2018-03-12 SURGICAL SUPPLY — 24 items
BATTERY INSTRU NAVIGATION (MISCELLANEOUS) ×15 IMPLANT
CANISTER SUCT 1200ML W/VALVE (MISCELLANEOUS) ×5 IMPLANT
COAG SUCT 10F 3.5MM HAND CTRL (MISCELLANEOUS) ×5 IMPLANT
DRAPE HEAD BAR (DRAPES) ×5 IMPLANT
DRESSING NASL FOAM PST OP SINU (MISCELLANEOUS) ×4 IMPLANT
DRSG NASAL FOAM POST OP SINU (MISCELLANEOUS) ×5
ELECT REM PT RETURN 9FT ADLT (ELECTROSURGICAL) ×5
ELECTRODE REM PT RTRN 9FT ADLT (ELECTROSURGICAL) ×4 IMPLANT
GLOVE BIO SURGEON STRL SZ7.5 (GLOVE) ×15 IMPLANT
KIT TURNOVER KIT A (KITS) ×5 IMPLANT
NEEDLE HYPO 25GX1X1/2 BEV (NEEDLE) ×5 IMPLANT
NS IRRIG 500ML POUR BTL (IV SOLUTION) ×5 IMPLANT
PACK ENT CUSTOM (PACKS) ×5 IMPLANT
PACKING NASAL EPIS 4X2.4 XEROG (MISCELLANEOUS) ×5 IMPLANT
PATTIES SURGICAL .5 X3 (DISPOSABLE) ×10 IMPLANT
SOL ANTI-FOG 6CC FOG-OUT (MISCELLANEOUS) ×4 IMPLANT
SOL FOG-OUT ANTI-FOG 6CC (MISCELLANEOUS) ×1
STRAP BODY AND KNEE 60X3 (MISCELLANEOUS) ×5 IMPLANT
SYR 10ML LL (SYRINGE) ×5 IMPLANT
SYR 30ML LL (SYRINGE) ×5 IMPLANT
SYR BULB 3OZ (MISCELLANEOUS) ×5 IMPLANT
TOWEL OR 17X26 4PK STRL BLUE (TOWEL DISPOSABLE) ×5 IMPLANT
TRACKER CRANIALMASK (MASK) ×5 IMPLANT
WATER STERILE IRR 250ML POUR (IV SOLUTION) ×5 IMPLANT

## 2018-03-12 NOTE — Anesthesia Postprocedure Evaluation (Signed)
Anesthesia Post Note  Patient: Greg KirschnerJames R Carroll  Procedure(s) Performed: IMAGE GUIDED SINUS SURGERY (N/A Nose) MAXILLARY ANTROSTOMY WITH TISSUE REMOVAL (Left Nose) ANTERIOR ETHMOIDECTOMY (Left Nose) INFERIOR TURBINATE REDUCTION (Bilateral Nose)  Patient location during evaluation: PACU Anesthesia Type: General Level of consciousness: awake and alert Pain management: pain level controlled Vital Signs Assessment: post-procedure vital signs reviewed and stable Respiratory status: spontaneous breathing, nonlabored ventilation, respiratory function stable and patient connected to nasal cannula oxygen Cardiovascular status: blood pressure returned to baseline and stable Postop Assessment: no apparent nausea or vomiting Anesthetic complications: no    Kiev Labrosse

## 2018-03-12 NOTE — Anesthesia Preprocedure Evaluation (Signed)
Anesthesia Evaluation  Patient identified by MRN, date of birth, ID band  Reviewed: NPO status   History of Anesthesia Complications Negative for: history of anesthetic complications  Airway Mallampati: II  TM Distance: >3 FB Neck ROM: full    Dental no notable dental hx.    Pulmonary neg pulmonary ROS, former smoker,    Pulmonary exam normal        Cardiovascular Exercise Tolerance: Good negative cardio ROS Normal cardiovascular exam     Neuro/Psych negative neurological ROS  negative psych ROS   GI/Hepatic Neg liver ROS, GERD  Controlled,  Endo/Other  negative endocrine ROS  Renal/GU negative Renal ROS  negative genitourinary   Musculoskeletal   Abdominal   Peds  Hematology negative hematology ROS (+)   Anesthesia Other Findings Cath: 2017: no CAD  Reproductive/Obstetrics                             Anesthesia Physical Anesthesia Plan  ASA: II  Anesthesia Plan: General ETT   Post-op Pain Management:    Induction:   PONV Risk Score and Plan:   Airway Management Planned:   Additional Equipment:   Intra-op Plan:   Post-operative Plan:   Informed Consent: I have reviewed the patients History and Physical, chart, labs and discussed the procedure including the risks, benefits and alternatives for the proposed anesthesia with the patient or authorized representative who has indicated his/her understanding and acceptance.     Plan Discussed with: CRNA  Anesthesia Plan Comments:         Anesthesia Quick Evaluation

## 2018-03-12 NOTE — H&P (Signed)
..  History and Physical paper copy reviewed and updated date of procedure and will be scanned into system.  Patient marked and examined.

## 2018-03-12 NOTE — Transfer of Care (Signed)
Immediate Anesthesia Transfer of Care Note  Patient: Elmon KirschnerJames R Symonds  Procedure(s) Performed: IMAGE GUIDED SINUS SURGERY (N/A Nose) MAXILLARY ANTROSTOMY WITH TISSUE REMOVAL (Left Nose) ANTERIOR ETHMOIDECTOMY (N/A Nose) INFERIOR TURBINATE REDUCTION (Bilateral Nose)  Patient Location: PACU  Anesthesia Type: General ETT  Level of Consciousness: awake, alert  and patient cooperative  Airway and Oxygen Therapy: Patient Spontanous Breathing and Patient connected to supplemental oxygen  Post-op Assessment: Post-op Vital signs reviewed, Patient's Cardiovascular Status Stable, Respiratory Function Stable, Patent Airway and No signs of Nausea or vomiting  Post-op Vital Signs: Reviewed and stable  Complications: No apparent anesthesia complications

## 2018-03-12 NOTE — Anesthesia Procedure Notes (Signed)
Procedure Name: Intubation Date/Time: 03/12/2018 8:55 AM Performed by: Jimmy PicketAmyot, Jarrad Mclees, CRNA Pre-anesthesia Checklist: Patient identified, Emergency Drugs available, Suction available, Patient being monitored and Timeout performed Patient Re-evaluated:Patient Re-evaluated prior to induction Oxygen Delivery Method: Circle system utilized Preoxygenation: Pre-oxygenation with 100% oxygen Induction Type: IV induction Ventilation: Mask ventilation without difficulty Laryngoscope Size: Miller and 3 Grade View: Grade II Tube type: Oral Rae Tube size: 7.5 mm Number of attempts: 1 Placement Confirmation: ETT inserted through vocal cords under direct vision,  positive ETCO2 and breath sounds checked- equal and bilateral Tube secured with: Tape Dental Injury: Teeth and Oropharynx as per pre-operative assessment

## 2018-03-12 NOTE — Op Note (Signed)
..03/12/2018  10:13 AM    Elmon KirschnerJames R Bransfield  [id number]    Pre-Op Dx: Left Chronic Maxillary and Ethmoid sinusitis, bilateral inferior turbinate hypertrophy  Post-op Dx: Same  Proc:   1)  Left maxillary antrostomy with tissue removal  2)  Left Anterior Ethmoidectomy  3)  Bilateral inferior turbinate reduction via resection of tissue  4)  Image Guidance Sinus Surgery  Surg:  Antionetta Ator  Anes:  GOT  EBL:  75ml  Comp:  none  Findings: Severe inflammation and edema of left maxillary sinus, left anterior ethmoid, purulence in left maxillary sinus, friable tissue throughout, Bilateral inferior turbinate hypertrophy, Left septal deviation and spur but able to access sinus without septoplasty.  Procedure: With the patient in a comfortable supine position,  general orotracheal anesthesia was induced without difficulty.  The patient received preoperative Afrin spray for topical decongestion and vasoconstriction.  At an appropriate level, the patient was placed in a semi-sitting position.  Nasal vibrissae were trimmed.   1% Xylocaine with 1:100,000 epinephrine, 9 cc's, was infiltrated into the anterior floor of the nose, into the nasal spine region, into the membranous columella, and finally into the submucoperichondrial plane of the septum on both sides.  Several minutes were allowed for this to take effect.  Cottoniod pledgetts soaked in Afrin were placed into both nasal cavities and left while the patient was prepped and draped in the standard fashion.   A proper time-out was performed.  The [employer] image guidance system was set up and calibrated in the normal fashion with an acceptable error of 6mm.   The materials were removed from the nose and observed to be intact and correct in number.  The nose was inspected with a headlight and zero degree endoscope with the findings as described above.  The inferior turbinates were then inspected.  Under endoscopic visualization, the  inferior turbinates were infractured bilaterally with a Therapist, nutritionalreer elevator.  A kelly clamp was attached to the anterior-inferior third of each inferior turbinate for approximately one minute.  Under endoscopic visualization, Tru-cutting forceps were used to remove the anterior-inferior third of each inferior turbinate.  Electrocautery was used to control bleeding in the area. The remaining turbinate was then outfractured to open up the airway further. There was no significant bleeding noted. The right turbinate was then trimmed and outfractured in a similar fashion.  The airways were then visualized and showed open passageways on both sides that were significantly improved compared to before surgery.       At this point, attention was directed to the functional endoscopic sinus surgery aspect of the procedure.  The nose was next inspected with a zero degree endoscope and the left middle turbinate wasmedialized and afrin soaked pledgets were placed lateral to the turbinates for approximately one minute.  At this time, the medial maxillary wall was noted to be bowing and thinned.  A ball tipped probe was inserted into the maxillary sinus through the natural ostia.  Using back-biting and side-biting forceps, the uncinate and medial wall around natural ostia was removed.  This demonstrated significant inflammation and friability of the tissue and polypoid appearance.  Multiple biopsies were taken for permanent pathology.  Purulence was noted along the maxillary sinus floor.  Using 30 and 70 degree scopes the ostia was enlarged with back-biters and the natural os was ensured to be connected to the surgical ostia.  The maxillary sinus was copiously irrigated and all purulence removed.  At this time, attention was directed to the anterior  ethmoid sinus.  The image guidance suction was used to enter the bulla inferior and medially.  Disease air cells were opened until the vertical lamella encountered.  The tissue and  mucosa was removed with ethmoid forceps.  At this time with all diseased sinuses opened, stamberger was placed along the cut edge of the inferior turbinates and the maxillary antrostomy.  Xerogel was placed lateral to the middle turbinate and stamberger on top of the xerogel after it was inflated with saline..  The patient was turned back over to anesthesia, and awakened, extubated, and taken to the PACU in satisfactory condition.  Dispo:   PACU to home  Plan: Ice, elevation, narcotic analgesia, and prophylactic antibiotics.  We will reevaluate the patient in the office in 7 days strenuous activities in two weeks.   Julyanna Scholle 03/12/2018 10:13 AM

## 2018-03-13 ENCOUNTER — Encounter: Payer: Self-pay | Admitting: Otolaryngology

## 2018-03-14 LAB — SURGICAL PATHOLOGY

## 2021-08-03 ENCOUNTER — Other Ambulatory Visit: Payer: Self-pay | Admitting: Physician Assistant

## 2021-08-03 ENCOUNTER — Ambulatory Visit
Admission: RE | Admit: 2021-08-03 | Discharge: 2021-08-03 | Disposition: A | Discharge status: Home / Self Care | Payer: Self-pay | Source: Ambulatory Visit | Attending: Physician Assistant | Admitting: Physician Assistant

## 2021-08-03 ENCOUNTER — Ambulatory Visit
Admission: RE | Admit: 2021-08-03 | Discharge: 2021-08-03 | Disposition: A | Discharge status: Home / Self Care | Payer: Self-pay | Attending: Physician Assistant | Admitting: Physician Assistant

## 2021-08-03 DIAGNOSIS — Z0000 Encounter for general adult medical examination without abnormal findings: Secondary | ICD-10-CM | POA: Insufficient documentation

## 2021-11-14 DIAGNOSIS — K640 First degree hemorrhoids: Secondary | ICD-10-CM | POA: Diagnosis not present

## 2021-11-14 DIAGNOSIS — Z1211 Encounter for screening for malignant neoplasm of colon: Secondary | ICD-10-CM | POA: Diagnosis not present

## 2021-12-11 DIAGNOSIS — E7800 Pure hypercholesterolemia, unspecified: Secondary | ICD-10-CM | POA: Diagnosis not present

## 2021-12-11 DIAGNOSIS — Z862 Personal history of diseases of the blood and blood-forming organs and certain disorders involving the immune mechanism: Secondary | ICD-10-CM | POA: Diagnosis not present

## 2021-12-11 DIAGNOSIS — Z125 Encounter for screening for malignant neoplasm of prostate: Secondary | ICD-10-CM | POA: Diagnosis not present

## 2021-12-11 DIAGNOSIS — R7303 Prediabetes: Secondary | ICD-10-CM | POA: Diagnosis not present

## 2022-04-24 DIAGNOSIS — Z111 Encounter for screening for respiratory tuberculosis: Secondary | ICD-10-CM | POA: Diagnosis not present

## 2022-05-28 DIAGNOSIS — R69 Illness, unspecified: Secondary | ICD-10-CM | POA: Diagnosis not present

## 2022-05-28 DIAGNOSIS — R7303 Prediabetes: Secondary | ICD-10-CM | POA: Diagnosis not present

## 2022-05-28 DIAGNOSIS — E7800 Pure hypercholesterolemia, unspecified: Secondary | ICD-10-CM | POA: Diagnosis not present

## 2022-05-28 DIAGNOSIS — B001 Herpesviral vesicular dermatitis: Secondary | ICD-10-CM | POA: Diagnosis not present

## 2022-06-04 DIAGNOSIS — E6609 Other obesity due to excess calories: Secondary | ICD-10-CM | POA: Diagnosis not present

## 2022-06-04 DIAGNOSIS — R7303 Prediabetes: Secondary | ICD-10-CM | POA: Diagnosis not present

## 2022-06-04 DIAGNOSIS — R69 Illness, unspecified: Secondary | ICD-10-CM | POA: Diagnosis not present

## 2022-06-04 DIAGNOSIS — Z6833 Body mass index (BMI) 33.0-33.9, adult: Secondary | ICD-10-CM | POA: Diagnosis not present

## 2022-06-04 DIAGNOSIS — E782 Mixed hyperlipidemia: Secondary | ICD-10-CM | POA: Diagnosis not present

## 2022-06-04 DIAGNOSIS — Z111 Encounter for screening for respiratory tuberculosis: Secondary | ICD-10-CM | POA: Diagnosis not present

## 2022-06-04 DIAGNOSIS — Z0000 Encounter for general adult medical examination without abnormal findings: Secondary | ICD-10-CM | POA: Diagnosis not present

## 2022-06-07 DIAGNOSIS — Z23 Encounter for immunization: Secondary | ICD-10-CM | POA: Diagnosis not present

## 2022-06-11 DIAGNOSIS — Z111 Encounter for screening for respiratory tuberculosis: Secondary | ICD-10-CM | POA: Diagnosis not present

## 2022-06-14 DIAGNOSIS — Z9229 Personal history of other drug therapy: Secondary | ICD-10-CM | POA: Diagnosis not present

## 2022-07-17 DIAGNOSIS — F411 Generalized anxiety disorder: Secondary | ICD-10-CM | POA: Diagnosis not present

## 2023-05-03 IMAGING — CR DG CHEST 1V
1 series · 1 of 1 positions shown · non-contrast
Comparison: 09/30/2008

CLINICAL DATA: 45-year-old male with screening chest x-ray

EXAM:
CHEST  1 VIEW

[dg chest 1 view]
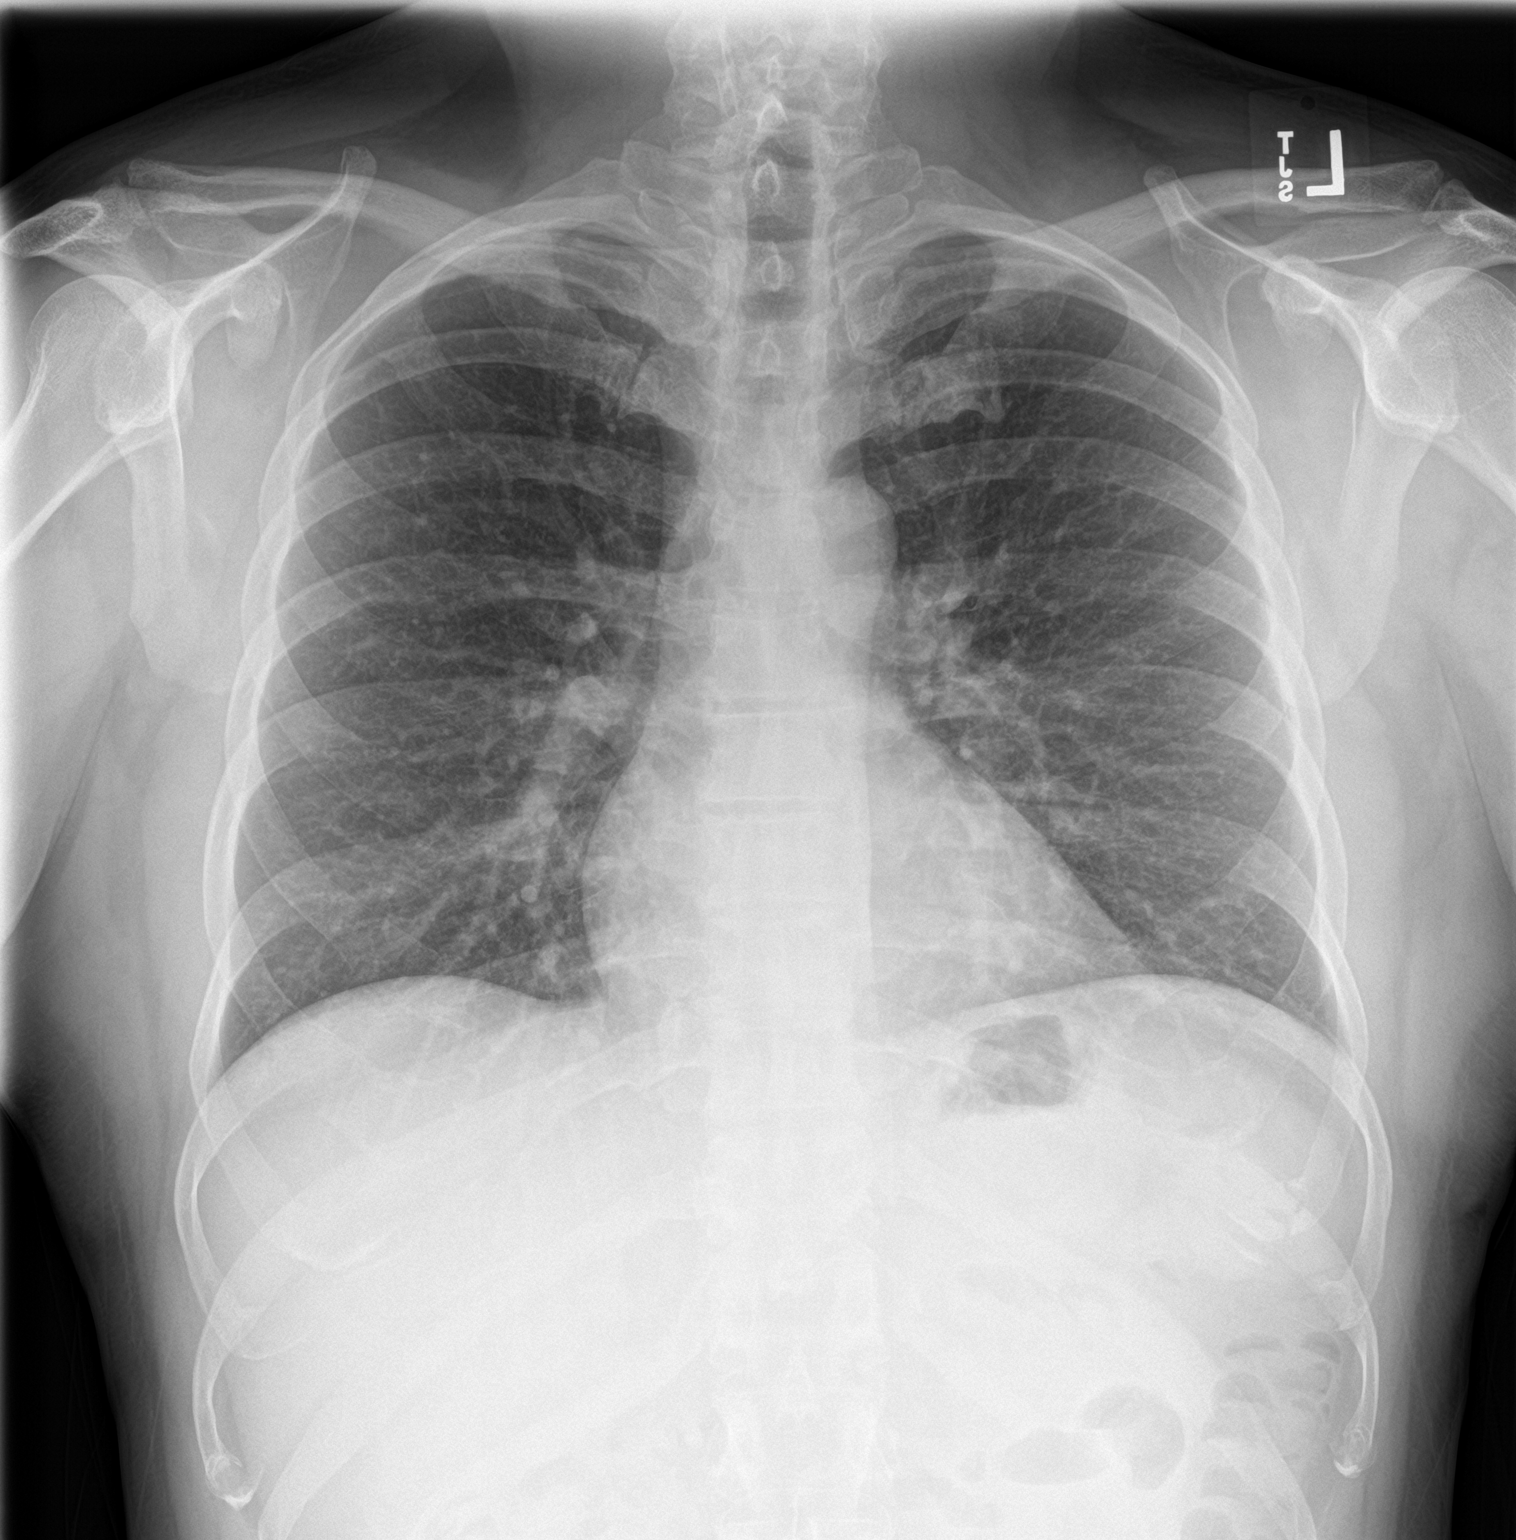

[1 of 1 positions shown; findings below may reference images not displayed]

FINDINGS: Cardiomediastinal silhouette unchanged in size and contour. No
evidence of central vascular congestion. No interlobular septal
thickening. No pneumothorax or pleural effusion. No confluent
airspace disease.

No acute displaced fracture
IMPRESSION: No active disease.
